# Patient Record
Sex: Male | Born: 1940 | Race: White | Hispanic: No | Marital: Married
Health system: Southern US, Community
[De-identification: ages and names within clinical notes are randomized; demographics above are authoritative.]

---

## 2006-10-14 ENCOUNTER — Other Ambulatory Visit: Payer: Self-pay

## 2006-10-14 ENCOUNTER — Emergency Department: Payer: Self-pay | Admitting: Emergency Medicine

## 2006-10-16 ENCOUNTER — Other Ambulatory Visit: Payer: Self-pay

## 2006-10-16 ENCOUNTER — Emergency Department: Payer: Self-pay | Admitting: General Practice

## 2007-05-01 ENCOUNTER — Emergency Department: Payer: Self-pay | Admitting: Emergency Medicine

## 2007-05-01 ENCOUNTER — Other Ambulatory Visit: Payer: Self-pay

## 2007-05-20 ENCOUNTER — Ambulatory Visit: Payer: Self-pay | Admitting: Cardiology

## 2008-03-15 ENCOUNTER — Emergency Department: Payer: Self-pay | Admitting: Emergency Medicine

## 2008-03-16 ENCOUNTER — Ambulatory Visit: Payer: Self-pay | Admitting: Family Medicine

## 2008-09-21 ENCOUNTER — Ambulatory Visit: Payer: Self-pay | Admitting: Internal Medicine

## 2008-10-04 ENCOUNTER — Ambulatory Visit: Payer: Self-pay | Admitting: Internal Medicine

## 2008-10-21 ENCOUNTER — Ambulatory Visit: Payer: Self-pay | Admitting: Internal Medicine

## 2008-11-21 ENCOUNTER — Ambulatory Visit: Payer: Self-pay | Admitting: Internal Medicine

## 2008-12-22 ENCOUNTER — Ambulatory Visit: Payer: Self-pay | Admitting: Internal Medicine

## 2009-01-21 ENCOUNTER — Ambulatory Visit: Payer: Self-pay | Admitting: Internal Medicine

## 2009-02-21 ENCOUNTER — Ambulatory Visit: Payer: Self-pay | Admitting: Internal Medicine

## 2009-03-23 ENCOUNTER — Ambulatory Visit: Payer: Self-pay | Admitting: Internal Medicine

## 2009-04-23 ENCOUNTER — Ambulatory Visit: Payer: Self-pay | Admitting: Internal Medicine

## 2009-05-24 ENCOUNTER — Ambulatory Visit: Payer: Self-pay | Admitting: Internal Medicine

## 2009-06-21 ENCOUNTER — Ambulatory Visit: Payer: Self-pay | Admitting: Internal Medicine

## 2009-07-13 ENCOUNTER — Ambulatory Visit: Payer: Self-pay | Admitting: Ophthalmology

## 2009-08-30 ENCOUNTER — Ambulatory Visit: Payer: Self-pay | Admitting: Ophthalmology

## 2009-10-10 ENCOUNTER — Ambulatory Visit: Payer: Self-pay | Admitting: Internal Medicine

## 2009-10-21 ENCOUNTER — Ambulatory Visit: Payer: Self-pay | Admitting: Internal Medicine

## 2010-01-17 ENCOUNTER — Ambulatory Visit: Payer: Self-pay | Admitting: Vascular Surgery

## 2010-04-13 ENCOUNTER — Ambulatory Visit: Payer: Self-pay | Admitting: Family Medicine

## 2010-04-14 ENCOUNTER — Inpatient Hospital Stay: Payer: Self-pay | Admitting: Internal Medicine

## 2010-04-20 ENCOUNTER — Ambulatory Visit: Payer: Self-pay | Admitting: Internal Medicine

## 2010-08-04 ENCOUNTER — Ambulatory Visit: Payer: Self-pay | Admitting: Sports Medicine

## 2010-09-05 NOTE — Assessment & Plan Note (Signed)
[name], [name]                           MRN:          [id number]  DATE:05/20/2007                            DOB:          [birth year]    PRIMARY CARE PHYSICIAN:  Dr. Serita Grit.   CARDIAC:  Dr. Arnoldo Hooker.   REASON FOR PRESENTATION:  I am here for a second opinion.   HISTORY OF PRESENT ILLNESS:  The patient is a pleasant 70 year old  gentleman who reports a history of palpitations for about two years.  These have been sporadic and unpredictable.  He did have an episode that  was his most severe, leading him to the emergency room on May 01, 2007.  He did have a documented heart rate in the 180's and higher.  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  He has also had some sporadic palpitations that sound more like ectopy,  but these do not particularly bother him.  He has been taught vagal  maneuvers, and thinks that this does help when he tries carotid massage.  He says these episodes may happen a few times in a row in one day, and  then not again for six weeks or more.  He cannot bring them on.  He is  not particularly active because of pain in his great toe; however, with  his activities of daily living, he does not bring these one.  He has not  found any food triggers.  He has not had any syncope, though he does  feel somewhat lightheaded when this happens.  He denies any chest  discomfort, neck or arm discomfort.  He has not had any shortness of  breath, PND or orthopnea.   His workup has included apparently an echocardiogram, although I do not  have these results.  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  He has worn a home  monitor which he describes for 12 hours.   The patient has been treated with diltiazem he  thinks for about one  year.  He has a long smoking history and has had pulmonary function  testing.   PAST MEDICAL HISTORY:  1. Hypertension.  2. Ongoing tobacco abuse.   PAST SURGICAL HISTORY:  None.   ALLERGIES:  No known drug allergies.   CURRENT MEDICATIONS:  1. Diltiazem XR 240 mg daily.  2. Lisinopril 20 mg daily.   SOCIAL HISTORY:  The patient is retired.  He is married and has three  children.  He has smoked two packs of cigarettes per day for 50 years  and has quit 100 times.   FAMILY HISTORY:  Noncontributory for early coronary artery disease,  though his father was found dead in the bed at age 16.  He has a brother  who died at age 24, of unknown etiology.   REVIEW OF SYSTEMS:  As stated in the HPI and positive for pain in his  right great toe, scaly skin and negative for all other systems.   PHYSICAL EXAMINATION:  GENERAL:  The patient is in no distress.  VITAL SIGNS:  Blood pressure 162/88, heart rate 80 and regular, body  mass index 27, weight 200 pounds.  HEENT:  Eyes unremarkable.  Pupils equal, round, reactive to light.  Fundi within normal limits.  Oral mucosa unremarkable.  Poor dentition.  NECK:  No jugular venous distention at 45 degrees.  Carotid upstrokes  brisk and symmetric.  No bruits, no thyromegaly.  LYMPHATICS:  No cervical, axillary or inguinal adenopathy.  LUNGS:  Clear to auscultation bilaterally.  BACK:  No costovertebral angle tenderness.  CHEST:  Unremarkable.  HEART:  PMI not displaced or sustained.  S1 and S2 within normal limits.  No S3, no S4, no clicks, rubs, no murmurs.  ABDOMEN:  Obese, positive bowel sounds, normal in frequency and pitch.  No bruits, no rebound, no guarding, no midline pulsatile mass, no  hepatomegaly, no splenomegaly.  SKIN:  No rashes, no nodules.  EXTREMITIES:  With 2+pulses throughout.  No edema, no cyanosis, no  clubbing.  NEUROLOGIC:  Oriented to person, place and time.  Cranial nerves II-XII  grossly  intact.  Motor grossly intact.   ASSESSMENT/PLAN:  1. Palpitations:  The patient has supraventricular tachycardia.  He      presented for a second opinion.  I essentially reiterated      information provided by Dr. Gwen Pounds and by Dr. Letta Kocher.  I agree      with the current therapy and suggestion of an ablation, certainly      if he has any progressive symptoms or severe episodes such as he      had earlier this month.  He asked many questions and I answered      these.  He will consider whether he wants further evaluation.  2. Tobacco:  We discussed (greater than three minutes), smoking      cessation.  I described to him Chantix which he has heard about      before.  He will continue this conversation with Dr. Letta Kocher.  He      knows the importance of stopping smoking.  3. Hypertension:  His blood pressure is elevated; however, he has      therapy per his primary team.  I do note his blood pressure was      slightly elevated when he last saw Dr. Gwen Pounds.  Would suggest      therapeutic life style changes, which could include some weight      loss.  He does drink two mixed drinks a night and could reduce this      to one.  Otherwise, he is probably going to need some adjustment to      his antihypertensives, but I will defer to his primary team.   FOLLOWUP:  1. The patient will continue to follow up with Dr. Letta Kocher.  2. He should continue to follow up with Dr. Gwen Pounds.  3. I did give him information on [facility].  He      can discuss this with his physicians, or call here to schedule a      consultation with them, should he choose ablation therapy or have      worsening symptoms.     [name], MD, [facility]  Electronically Signed    JH/MedQ  DD: 05/20/2007  DT: 05/20/2007  Job #: [id number]   cc:   Arnoldo Hooker, M.D.  Serita Grit

## 2010-09-18 ENCOUNTER — Ambulatory Visit: Payer: Self-pay | Admitting: Neurology

## 2011-03-12 ENCOUNTER — Ambulatory Visit: Payer: Self-pay | Admitting: Neurology

## 2011-06-19 ENCOUNTER — Ambulatory Visit: Payer: Self-pay | Admitting: Vascular Surgery

## 2011-06-19 LAB — BASIC METABOLIC PANEL
Anion Gap: 5 — ABNORMAL LOW (ref 7–16)
Chloride: 103 mmol/L (ref 98–107)
Co2: 32 mmol/L (ref 21–32)
EGFR (African American): >60
EGFR (Non-African Amer.): >60
Osmolality: 279 (ref 275–301)

## 2012-04-28 ENCOUNTER — Inpatient Hospital Stay: Payer: Self-pay | Admitting: Specialist

## 2012-04-28 DIAGNOSIS — I517 Cardiomegaly: Secondary | ICD-10-CM

## 2012-04-28 LAB — CBC
HCT: 37.5 % — ABNORMAL LOW (ref 40.0–52.0)
HGB: 12 g/dL — ABNORMAL LOW (ref 13.0–18.0)
MCH: 24.7 pg — ABNORMAL LOW (ref 26.0–34.0)
MCHC: 31.9 g/dL — ABNORMAL LOW (ref 32.0–36.0)
MCV: 77 fL — ABNORMAL LOW (ref 80–100)
Platelet: 244 10*3/uL (ref 150–440)
RBC: 4.85 10*6/uL (ref 4.40–5.90)
RDW: 17.1 % — ABNORMAL HIGH (ref 11.5–14.5)
WBC: 6.3 10*3/uL (ref 3.8–10.6)

## 2012-04-28 LAB — BASIC METABOLIC PANEL
Anion Gap: 9 (ref 7–16)
Calcium, Total: 8.4 mg/dL — ABNORMAL LOW (ref 8.5–10.1)
EGFR (African American): >60
Osmolality: 276 (ref 275–301)
Potassium: 3.4 mmol/L — ABNORMAL LOW (ref 3.5–5.1)

## 2012-04-28 LAB — TROPONIN I: Troponin-I: 0.02 ng/mL

## 2012-04-28 LAB — PRO B NATRIURETIC PEPTIDE: B-Type Natriuretic Peptide: 154 pg/mL — ABNORMAL HIGH (ref 0–125)

## 2012-05-03 LAB — CULTURE, BLOOD (SINGLE)

## 2012-07-20 ENCOUNTER — Ambulatory Visit: Payer: Self-pay | Admitting: Family Medicine

## 2012-07-30 ENCOUNTER — Ambulatory Visit: Payer: Self-pay | Admitting: Family Medicine

## 2012-09-02 ENCOUNTER — Emergency Department: Payer: Self-pay | Admitting: Emergency Medicine

## 2012-09-02 LAB — COMPREHENSIVE METABOLIC PANEL
Albumin: 3.1 g/dL — ABNORMAL LOW (ref 3.4–5.0)
Anion Gap: 7 (ref 7–16)
BUN: 15 mg/dL (ref 7–18)
Bilirubin,Total: 0.6 mg/dL (ref 0.2–1.0)
Chloride: 103 mmol/L (ref 98–107)
Co2: 26 mmol/L (ref 21–32)
EGFR (African American): >60
Potassium: 3.9 mmol/L (ref 3.5–5.1)
SGOT(AST): 15 U/L (ref 15–37)
SGPT (ALT): 15 U/L (ref 12–78)
Total Protein: 7.5 g/dL (ref 6.4–8.2)

## 2012-09-02 LAB — CBC WITH DIFFERENTIAL/PLATELET
Basophil #: 0.1 10*3/uL (ref 0.0–0.1)
Basophil %: 0.4 %
Eosinophil #: 0 10*3/uL (ref 0.0–0.7)
Eosinophil %: 0.1 %
HCT: 40.8 % (ref 40.0–52.0)
HGB: 13.1 g/dL (ref 13.0–18.0)
Lymphocyte #: 1.2 10*3/uL (ref 1.0–3.6)
Lymphocyte %: 7.4 %
MCH: 26.3 pg (ref 26.0–34.0)
MCHC: 32.2 g/dL (ref 32.0–36.0)
MCV: 82 fL (ref 80–100)
Monocyte %: 8.3 %
Neutrophil #: 13.4 10*3/uL — ABNORMAL HIGH (ref 1.4–6.5)
Neutrophil %: 83.8 %
Platelet: 285 10*3/uL (ref 150–440)
RBC: 4.99 10*6/uL (ref 4.40–5.90)

## 2012-09-02 LAB — PRO B NATRIURETIC PEPTIDE: B-Type Natriuretic Peptide: 419 pg/mL — ABNORMAL HIGH (ref 0–125)

## 2012-11-21 DEATH — deceased

## 2014-08-13 NOTE — Discharge Summary (Signed)
PATIENT NAME:  [name], Yordan A MR#:  [id number] DATE OF BIRTH:  24-Aug-1940  DATE OF ADMISSION:  04/28/2012 DATE OF DISCHARGE:  04/28/2012  The patient actually left AGAINST MEDICAL ADVICE.   BRIEF HOSPITAL COURSE: This is a 74 year old male with history of severe COPD with ongoing tobacco abuse, with peripheral neuropathy and hypertension. He was admitted to the hospital for COPD exacerbation, started on maximal therapy with IV steroids, nebulizer treatments and IV antibiotics. The patient did not want to stay in the hospital to acquire any further care and therefore left AGAINST MEDICAL ADVICE shortly after admission on the day of 04/28/2012. The patient was made aware of the risks of signing AGAINST MEDICAL ADVICE and that he would be at high risk for further respiratory problems and acute respiratory failure, and he understood that. The patient signed a form for AGAINST MEDICAL ADVICE and left AMA on the afternoon of 04/28/2012.   TIME SPENT: 35 minutes.    ____________________________ Rolly PancakeVivek J. Cherlynn KaiserSainani, MD vjs:jm D: 05/06/2012 15:33:14 ET T: 05/06/2012 21:22:54 ET JOB#: [id number]  cc: Rolly PancakeVivek J. Cherlynn KaiserSainani, MD, <Dictator> [place] SirenVIVEK J SAINANI MD ELECTRONICALLY SIGNED 05/09/2012 21:03

## 2014-08-13 NOTE — H&P (Signed)
PATIENT NAME:  [name], [name] MR#:  [id number] DATE OF BIRTH:  [birth year]  DATE OF ADMISSION:  04/28/2012  PRIMARY CARE PHYSICIAN: Dr. Bari Edward   CHIEF COMPLAINT: Shortness of breath, cough.   HISTORY OF PRESENT ILLNESS: This is a 74 year old male who presents to the hospital with shortness of breath and cough ongoing for the past week to 2 weeks. The patient has a history of underlying chronic obstructive pulmonary disease with ongoing tobacco abuse and has had a cough with bronchospasm for about a week to 2 weeks. He was started on some Levaquin by his primary care physician just a few days ago, although, despite the antibiotics his clinical symptoms are not getting any better. He was developing worsening resting dyspnea, and therefore was brought to the Emergency Room. In the Emergency Room, the patient was noted to be hypoxic with positive use of accessory muscles for breathing and noted to be in COPD exacerbation. Hospitalist services were contacted for further treatment and evaluation. The patient does admit to a cough which is nonproductive. No fevers, no chills, no nausea, no vomiting, no chest pain, and no other associated symptoms presently.   REVIEW OF SYSTEMS:  CONSTITUTIONAL: No documented fever. No weight gain, no weight loss.  EYES: No blurry or double vision.  ENT: No tinnitus. No postnasal drip. No redness of the oropharynx.  RESPIRATORY: Positive cough. Positive wheeze. No hemoptysis. Positive dyspnea. Positive COPD.  CARDIOVASCULAR: No chest pain, no orthopnea, no palpitations, no syncope.  GASTROINTESTINAL: No nausea, no vomiting, no diarrhea, no abdominal pain, no melena, no hematochezia.  GENITOURINARY: No dysuria or hematuria.  ENDOCRINE: No polyuria or nocturia. No heat or cold intolerance.  HEMATOLOGIC/LYMPHATIC: No anemia, no bruising, no bleeding.  INTEGUMENTARY: No rashes. No lesions.  MUSCULOSKELETAL: No arthritis, no swelling, no gout.  NEUROLOGICAL: No  numbness, no tingling, no ataxia, no seizure-type activity.  PSYCHIATRIC: No anxiety, no insomnia, no ADD.   PAST MEDICAL HISTORY: Consistent with COPD with ongoing tobacco abuse, peripheral neuropathy, hypertension.   ALLERGIES: No known drug allergies.   SOCIAL HISTORY: Smokes about 2 packs per day, has been smoking for the past 40+ years. No alcohol abuse. No illicit drug abuse. Lives at home by himself.   FAMILY HISTORY: Mother died from complications of Alzheimer's dementia. Father died from an MI.   CURRENT MEDICATIONS: Albuterol nebulizers q. 6 hours as needed, Cardizem CD 240 mg daily, gabapentin 300 mg 1 cap q.i.d., Levaquin 500 mg daily for 10 days, started 2 days ago, prednisone 5 mg 3 times daily maintenance.   PHYSICAL EXAMINATION:  VITAL SIGNS:  On admission, temperature is 97.8, pulse 91, respirations 24, blood pressure 177/84, saturations 96% on 2 liters nasal cannula.  GENERAL: He is a pleasant-appearing male in mild respiratory distress.  HEENT: Atraumatic, normocephalic. Extraocular muscles are intact. Pupils are equal and reactive to light. Sclerae are anicteric. No conjunctival injection. No pharyngeal erythema.  HEART: Sounds are regular rate and rhythm. No murmurs, no rubs, and no clicks, distant heart sounds.  LUNGS: Prolonged inspiratory and expiratory effort. Negative use of accessory muscles. Dullness to percussion.  ABDOMEN: Soft, flat, nontender, nondistended. Has good bowel sounds. No hepatosplenomegaly appreciated.  EXTREMITIES: No evidence of any cyanosis, clubbing; +2 pitting edema from knees to the ankles bilaterally; +2 pedal and radial pulses bilaterally.  NEUROLOGICAL: The patient is alert, awake, and oriented x 3. No focal motor or sensory deficits appreciated bilaterally. The patient is chronically bedbound and uses a scooter to get around.  SKIN: Moist and warm, has multiple seborrheic keratoses throughout body.  LYMPHATIC: There is no cervical or  axillary lymphadenopathy.   LABORATORY, DIAGNOSTIC AND RADIOLOGICAL DATA:  Serum glucose of 86, BUN 15, creatinine 0.8, sodium 138, potassium 3.4, chloride 101, bicarbonate 28.0. Troponin less than 0.02. White cell count 6.3, hemoglobin 12, hematocrit 37.5, platelet count 244.  The patient did have a chest x-ray done which showed hyperinflated lung fields consistent with COPD, otherwise no acute cardiopulmonary disease.   ASSESSMENT AND PLAN: This is a 74 year old male with a history of peripheral neuropathy, COPD with ongoing tobacco abuse, hypertension, presents to the hospital with shortness of breath, cough, and hypoxia likely related to COPD exacerbation.  1. Chronic obstructive pulmonary disease exacerbation: This is likely secondary to the patient's ongoing tobacco abuse, complicated with underlying acute bronchitis. The patient has failed outpatient therapy with p.o. Levaquin and prednisone. I will, therefore, start the patient on IV Solu-Medrol, continue him on DuoNebs around-the-clock, also continue empiric Levaquin, follow sputum and blood cultures. The patient is currently not on any maintenance inhalers, therefore, we will start him on some Advair and Spiriva. We will also assess him for home oxygen prior to discharge.  2. Hypertension: Continue with Cardizem. Follow hemodynamics.  3. Neuropathy: Continue Neurontin. 4. Lower extremity edema: The patient has +2 pitting edema bilaterally. Questionable if this is related to venous stasis from his poor mobility as he is currently bedbound. Also, questionable evidence of right-sided heart failure given his severe COPD. His BNP is only 154. I will go ahead and check a 2-dimensional echocardiogram for now.   CODE STATUS: The patient is a FULL CODE.    TIME SPENT WITH ADMISSION:  50 minutes.  ____________________________ Rolly PancakeVivek J. Cherlynn KaiserSainani, MD vjs:cb D: 04/28/2012 12:00:27 ET T: 04/28/2012 12:09:53 ET JOB#: [id number]  cc: Rolly PancakeVivek J. Cherlynn KaiserSainani, MD,  <Dictator> [place] SirenVIVEK J Shaleah Nissley MD ELECTRONICALLY SIGNED 04/30/2012 14:08

## 2014-08-15 NOTE — Op Note (Signed)
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   PROCEDURES PERFORMED:  1. Abdominal aortogram.  2. Right lower extremity angiography, third order catheter placement.  3. Percutaneous transluminal angioplasty at two locations, right superficial femoral artery.   SURGEON: Levora Dredge, M.D.   SEDATION: Versed 4 mg plus fentanyl 200 mcg administered IV. Continuous ECG, pulse oximetry and cardiopulmonary monitoring was performed throughout the entire procedure by the interventional radiology nurse. Total sedation time was 1 hour, 20 minutes.   ACCESS: 6 French sheath left common femoral artery.   CONTRAST: Isovue 88 mL.   FLUOROSCOPY TIME: 4.3 minutes.   INDICATIONS: [name] is a 74 year old gentleman who presents to the office with gangrene of the right foot. He has known atherosclerotic occlusive disease. He is therefore scheduled to undergo angiography with the hope for intervention for limb salvage. Risks and benefits were reviewed, all questions answered, and the patient agrees to proceed.   DESCRIPTION OF PROCEDURE: The patient is taken to special procedures and placed in the supine position. After adequate sedation is achieved, both groins are prepped and draped in sterile fashion. Ultrasound is placed in a sterile sleeve. Ultrasound is utilized secondary to a large abdominal pannus and lack of appropriate landmarks. Under direct ultrasound visualization, the femoral artery is identified. It is echolucent and pulsatile indicating patency. Image is recorded for the permanent record. Under direct ultrasound visualization, micropuncture  needle is inserted into the anterior wall of the common femoral artery. Microwire followed by microsheath, J-wire followed by 5 French sheath and 5 French pigtail catheter. The pigtail catheter is positioned at the level of T12 and bolus injection of contrast is utilized to demonstrate the abdominal aorta in the AP projection. The pigtail catheter is repositioned and bilateral oblique views of the pelvis are obtained. A stiff angled Glidewire and pigtail catheter are then used to cross the aortic bifurcation and the wire and pigtail catheter are negotiated into the superficial femoral artery. Distal runoff is then obtained with hand injection of contrast. An 80% to 90% stenosis is noted in the proximal one third of the SFA. At [facility] canal, there is subtotal occlusion, which is also identified. Above-knee popliteal is patent as is the trifurcation and there is two-vessel runoff to the foot via the anterior tibial and posterior tibial. The posterior tibial is the dominant artery and fills the lateral plantar and the pedal arch. The anterior tibial appears to occlude at the level of the dorsalis pedis and does have multiple smaller collaterals that do also fill the dorsum of the foot. The peroneal was quite small and does not appear to have significant contribution at the level of the foot.   4000 units of heparin is given and a stiff angled Glidewire and straight glide catheter are then advanced after exchanging the sheath for a 6 French Ansel sheath. Once the catheter/wire combination has crossed the Hunter's canal lesion, hand injection of contrast verifies intraluminal placement. Magic torque wire is then advanced down and a 6 x 4 balloon is used to angioplasty both sites. Inflations are to 14 atmospheres for one minute. Follow-up angiography demonstrates complete resolution of both lesions, no evidence of dissection, and therefore no  further treatments are required.   The sheath is then pulled into the  external iliac, on the left, oblique view of the left groin is obtained, and a StarClose device is deployed without difficulty. There are no immediate complications.   INTERPRETATION: As noted above, the aorta and iliac arteries are widely patent bilaterally. Previously placed left external iliac artery stent is widely patent.   The right common femoral and profunda femoris are widely patent. Superficial femoral artery demonstrates two very focal stenoses with reconstitution of the popliteal and runoff, as described above. Following angioplasty there is minimal residual stenosis and no evidence of dissection; therefore, no stenting is required at this time. Of note, the left common femoral demonstrates moderate disease and a flush occlusion of the superficial femoral artery. Profunda femoris is widely patent.     SUMMARY: Successful angioplasty of the superficial femoral artery two locations to provide in-line flow to the foot for limb salvage. ____________________________ Renford DillsGregory G. Seraphim Affinito, MD ggs:slb D: 06/19/2011 19:13:45 ET T: 06/20/2011 09:01:04 ET JOB#: [id number]  cc: Renford DillsGregory G. Iana Buzan, MD, <Dictator> Renford DillsGREGORY G Rayelynn Loyal MD ELECTRONICALLY SIGNED 06/29/2011 10:48

## 2014-09-20 IMAGING — CR DG RIBS 2V*R*
1 series · 5 of 5 positions shown · non-contrast
Comparison: none

REASON FOR EXAM: fell twice in last 10 days hitting right posterior
lateral ribs
COMMENTS:   LMP: (Male)

[Series 1: ap · 0.17mm/px · 5 of 5 slices shown]
[im 1/5]
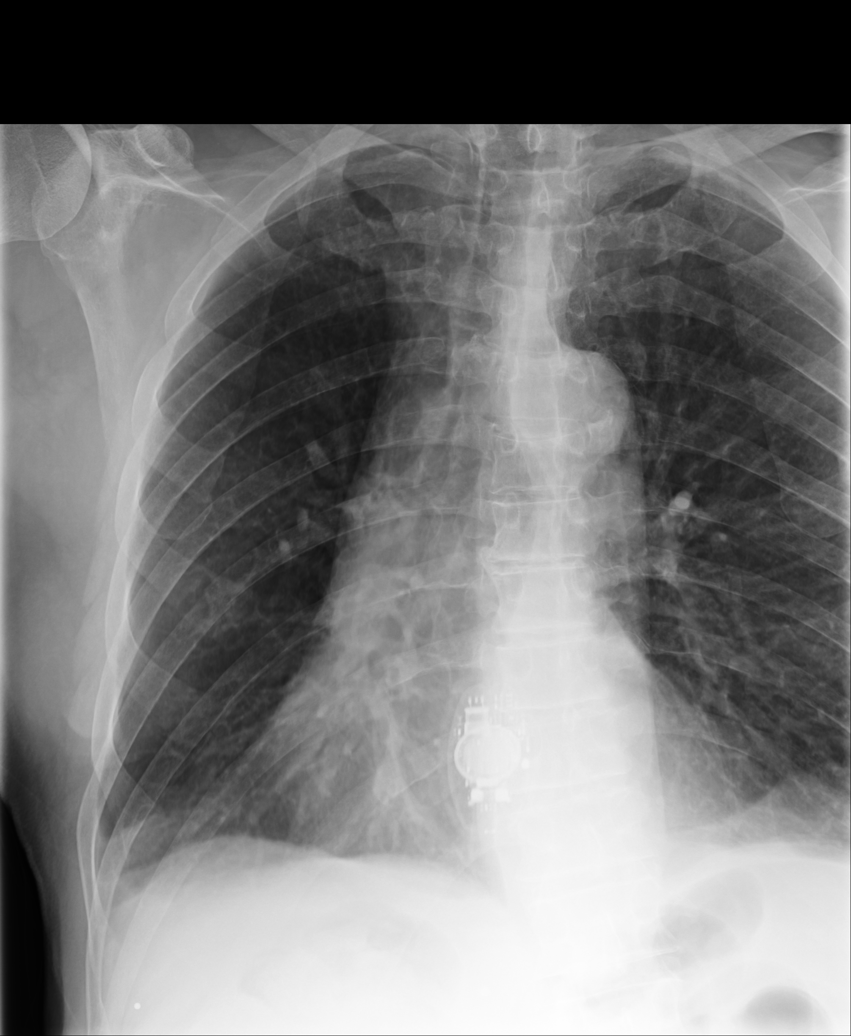
[im 2/5]
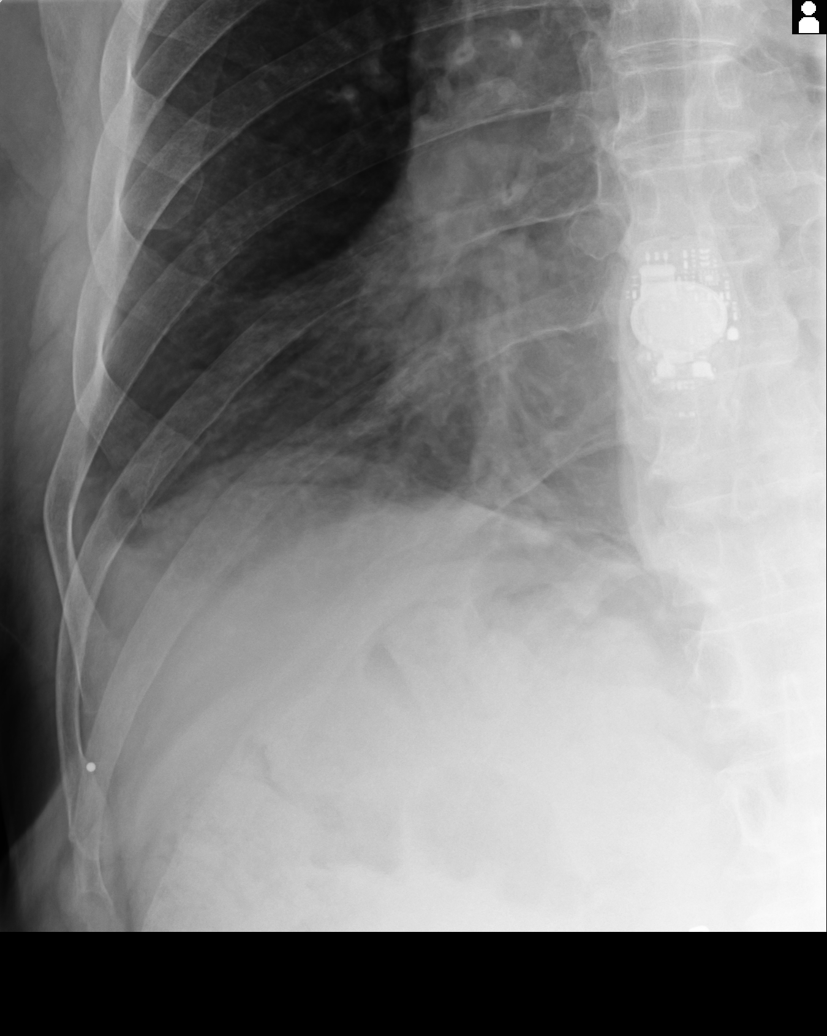
[im 3/5]
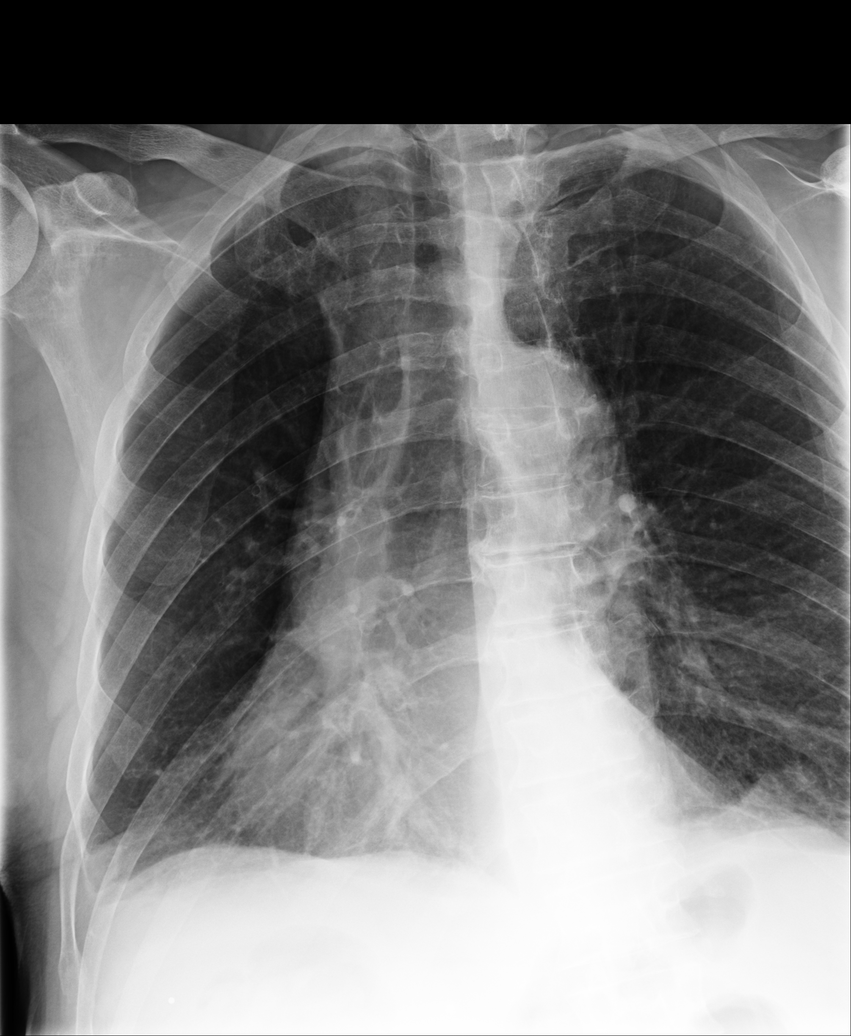
[im 4/5]
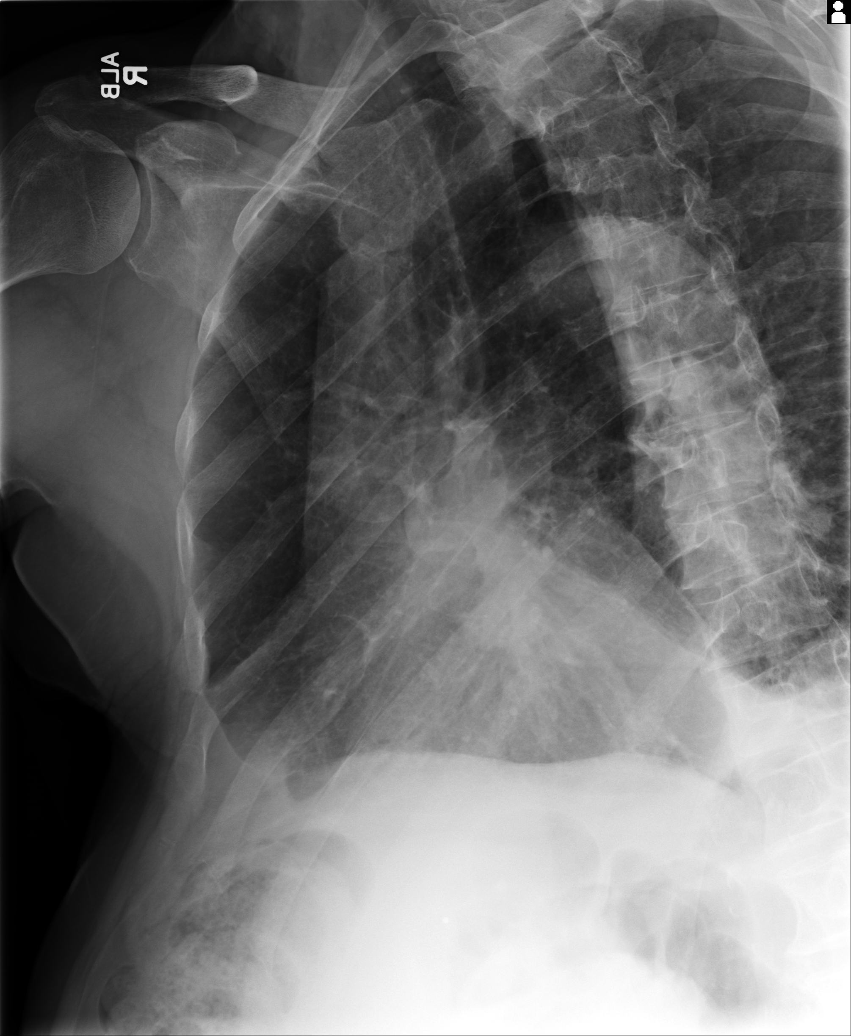
[im 5/5]
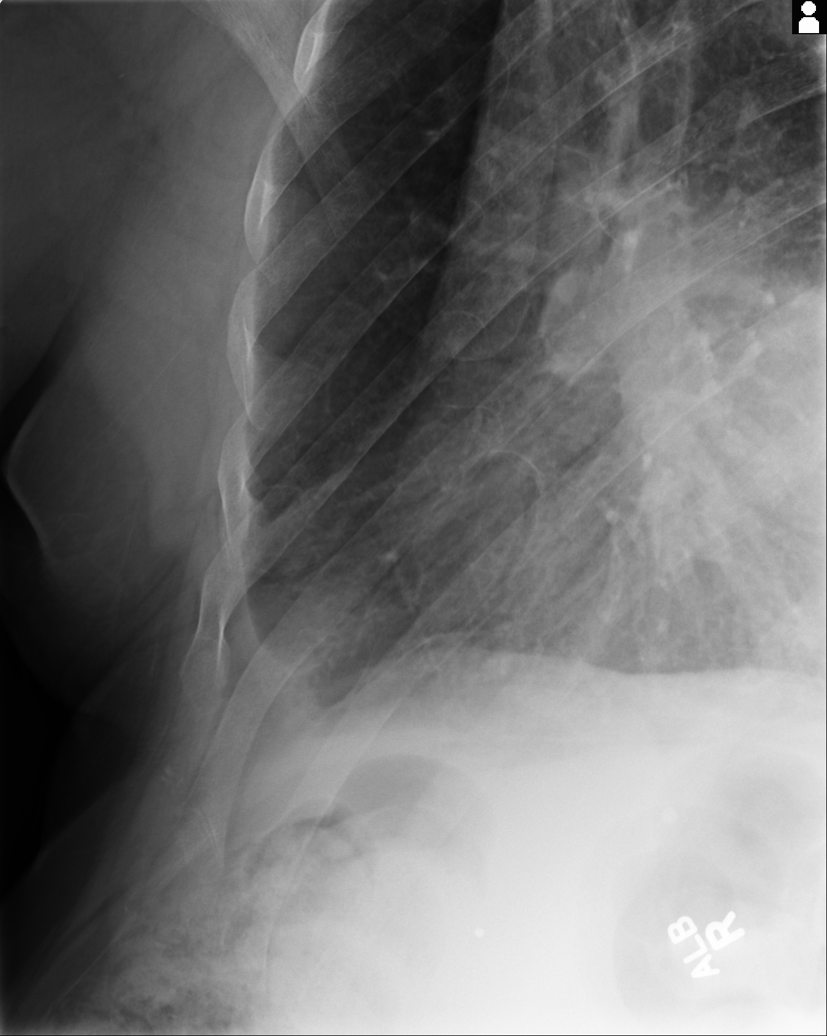

[5 of 5 positions shown; findings below may reference images not displayed]

PROCEDURE:     MDR - MDR RIBS RIGHT UNLILATERAL  - July 30, 2012  [DATE]

RESULT:     Five views of the right ribs are submitted. A metallic BB was
placed over the lower right lateral rib cage. The ribs appear adequately
mineralized for age. No acute displaced fracture is demonstrated. There is a
small amount of atelectasis in the lateral costophrenic gutter. There is an
electronic device projecting over the lower thoracic spine.
IMPRESSION: There is no definite evidence of an acute rib fracture.
There is blunting of the lateral costophrenic angle which is not entirely
new but is more conspicuous than in March 2010. No pneumothorax is
evident.

[REDACTED]
